# Patient Record
Sex: Male | Born: 1985 | Race: White | Hispanic: No | Marital: Single | Smoking: Current every day smoker
Health system: Southern US, Community
[De-identification: ages and names within clinical notes are randomized; demographics above are authoritative.]

## PROBLEM LIST (undated history)

## (undated) HISTORY — PX: KIDNEY SURGERY: SHX687

---

## 2015-07-10 ENCOUNTER — Emergency Department (HOSPITAL_COMMUNITY)
Admission: EM | Admit: 2015-07-10 | Discharge: 2015-07-11 | Disposition: A | Discharge status: Home / Self Care | Payer: No Typology Code available for payment source | Attending: Emergency Medicine | Admitting: Emergency Medicine

## 2015-07-10 ENCOUNTER — Encounter (HOSPITAL_COMMUNITY): Payer: Self-pay

## 2015-07-10 ENCOUNTER — Emergency Department (HOSPITAL_COMMUNITY): Payer: No Typology Code available for payment source

## 2015-07-10 DIAGNOSIS — Y9389 Activity, other specified: Secondary | ICD-10-CM | POA: Insufficient documentation

## 2015-07-10 DIAGNOSIS — S060X0A Concussion without loss of consciousness, initial encounter: Secondary | ICD-10-CM | POA: Diagnosis not present

## 2015-07-10 DIAGNOSIS — Y998 Other external cause status: Secondary | ICD-10-CM | POA: Diagnosis not present

## 2015-07-10 DIAGNOSIS — S0990XA Unspecified injury of head, initial encounter: Secondary | ICD-10-CM | POA: Diagnosis present

## 2015-07-10 DIAGNOSIS — S199XXA Unspecified injury of neck, initial encounter: Secondary | ICD-10-CM | POA: Insufficient documentation

## 2015-07-10 DIAGNOSIS — S0003XA Contusion of scalp, initial encounter: Secondary | ICD-10-CM | POA: Insufficient documentation

## 2015-07-10 DIAGNOSIS — Y92410 Unspecified street and highway as the place of occurrence of the external cause: Secondary | ICD-10-CM | POA: Diagnosis not present

## 2015-07-10 MED ORDER — HYDROCODONE-ACETAMINOPHEN 5-325 MG PO TABS
2.0000 | ORAL_TABLET | Freq: Once | ORAL | Status: AC
  Administered 2015-07-10: 2 via ORAL
  Filled 2015-07-10: qty 2
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
## 2015-07-11 MED ORDER — HYDROCODONE-ACETAMINOPHEN 5-325 MG PO TABS: ORAL_TABLET | ORAL | Status: AC

## 2015-07-11 NOTE — Discharge Instructions (Signed)
Rest, Ice intermittently (in the first 24-48 hours)   Take up to 800mg  of ibuprofen (that is usually 4 over the counter pills)  3 times a day for 5 days. Take with food.  Take vicodin for breakthrough pain, do not drink alcohol, drive, care for children or do other critical tasks while taking vicodin.  Do not participate in any sports or any activities that could result in head trauma until you are cleared by your primary care physician or neurologist.   Do not hesitate to return to the emergency room for any new, worsening or concerning symptoms.  Please obtain primary care using resource guide below. Let them know that you were seen in the emergency room and that they will need to obtain records for further outpatient management.   Concussion, Adult A concussion, or closed-head injury, is a brain injury caused by a direct blow to the head or by a quick and sudden movement (jolt) of the head or neck. Concussions are usually not life-threatening. Even so, the effects of a concussion can be serious. If you have had a concussion before, you are more likely to experience concussion-like symptoms after a direct blow to the head.  CAUSES  Direct blow to the head, such as from running into another player during a soccer game, being hit in a fight, or hitting your head on a hard surface.  A jolt of the head or neck that causes the brain to move back and forth inside the skull, such as in a car crash. SIGNS AND SYMPTOMS The signs of a concussion can be hard to notice. Early on, they may be missed by you, family members, and health care providers. You may look fine but act or feel differently. Symptoms are usually temporary, but they may last for days, weeks, or even longer. Some symptoms may appear right away while others may not show up for hours or days. Every head injury is different. Symptoms include:  Mild to moderate headaches that will not go away.  A feeling of pressure inside your  head.  Having more trouble than usual:  Learning or remembering things you have heard.  Answering questions.  Paying attention or concentrating.  Organizing daily tasks.  Making decisions and solving problems.  Slowness in thinking, acting or reacting, speaking, or reading.  Getting lost or being easily confused.  Feeling tired all the time or lacking energy (fatigued).  Feeling drowsy.  Sleep disturbances.  Sleeping more than usual.  Sleeping less than usual.  Trouble falling asleep.  Trouble sleeping (insomnia).  Loss of balance or feeling lightheaded or dizzy.  Nausea or vomiting.  Numbness or tingling.  Increased sensitivity to:  Sounds.  Lights.  Distractions.  Vision problems or eyes that tire easily.  Diminished sense of taste or smell.  Ringing in the ears.  Mood changes such as feeling sad or anxious.  Becoming easily irritated or angry for little or no reason.  Lack of motivation.  Seeing or hearing things other people do not see or hear (hallucinations). DIAGNOSIS Your health care provider can usually diagnose a concussion based on a description of your injury and symptoms. He or she will ask whether you passed out (lost consciousness) and whether you are having trouble remembering events that happened right before and during your injury. Your evaluation might include:  A brain scan to look for signs of injury to the brain. Even if the test shows no injury, you may still have a concussion.  Blood  tests to be sure other problems are not present. TREATMENT  Concussions are usually treated in an emergency department, in urgent care, or at a clinic. You may need to stay in the hospital overnight for further treatment.  Tell your health care provider if you are taking any medicines, including prescription medicines, over-the-counter medicines, and natural remedies. Some medicines, such as blood thinners (anticoagulants) and aspirin, may  increase the chance of complications. Also tell your health care provider whether you have had alcohol or are taking illegal drugs. This information may affect treatment.  Your health care provider will send you home with important instructions to follow.  How fast you will recover from a concussion depends on many factors. These factors include how severe your concussion is, what part of your brain was injured, your age, and how healthy you were before the concussion.  Most people with mild injuries recover fully. Recovery can take time. In general, recovery is slower in older persons. Also, persons who have had a concussion in the past or have other medical problems may find that it takes longer to recover from their current injury. HOME CARE INSTRUCTIONS General Instructions  Carefully follow the directions your health care provider gave you.  Only take over-the-counter or prescription medicines for pain, discomfort, or fever as directed by your health care provider.  Take only those medicines that your health care provider has approved.  Do not drink alcohol until your health care provider says you are well enough to do so. Alcohol and certain other drugs may slow your recovery and can put you at risk of further injury.  If it is harder than usual to remember things, write them down.  If you are easily distracted, try to do one thing at a time. For example, do not try to watch TV while fixing dinner.  Talk with family members or close friends when making important decisions.  Keep all follow-up appointments. Repeated evaluation of your symptoms is recommended for your recovery.  Watch your symptoms and tell others to do the same. Complications sometimes occur after a concussion. Older adults with a brain injury may have a higher risk of serious complications, such as a blood clot on the brain.  Tell your teachers, school nurse, school counselor, coach, athletic trainer, or work  Production designer, theatre/television/film about your injury, symptoms, and restrictions. Tell them about what you can or cannot do. They should watch for:  Increased problems with attention or concentration.  Increased difficulty remembering or learning new information.  Increased time needed to complete tasks or assignments.  Increased irritability or decreased ability to cope with stress.  Increased symptoms.  Rest. Rest helps the brain to heal. Make sure you:  Get plenty of sleep at night. Avoid staying up late at night.  Keep the same bedtime hours on weekends and weekdays.  Rest during the day. Take daytime naps or rest breaks when you feel tired.  Limit activities that require a lot of thought or concentration. These include:  Doing homework or job-related work.  Watching TV.  Working on the computer.  Avoid any situation where there is potential for another head injury (football, hockey, soccer, basketball, martial arts, downhill snow sports and horseback riding). Your condition will get worse every time you experience a concussion. You should avoid these activities until you are evaluated by the appropriate follow-up health care providers. Returning To Your Regular Activities You will need to return to your normal activities slowly, not all at once. You must  give your body and brain enough time for recovery.  Do not return to sports or other athletic activities until your health care provider tells you it is safe to do so.  Ask your health care provider when you can drive, ride a bicycle, or operate heavy machinery. Your ability to react may be slower after a brain injury. Never do these activities if you are dizzy.  Ask your health care provider about when you can return to work or school. Preventing Another Concussion It is very important to avoid another brain injury, especially before you have recovered. In rare cases, another injury can lead to permanent brain damage, brain swelling, or death. The  risk of this is greatest during the first 7-10 days after a head injury. Avoid injuries by:  Wearing a seat belt when riding in a car.  Drinking alcohol only in moderation.  Wearing a helmet when biking, skiing, skateboarding, skating, or doing similar activities.  Avoiding activities that could lead to a second concussion, such as contact or recreational sports, until your health care provider says it is okay.  Taking safety measures in your home.  Remove clutter and tripping hazards from floors and stairways.  Use grab bars in bathrooms and handrails by stairs.  Place non-slip mats on floors and in bathtubs.  Improve lighting in dim areas. SEEK MEDICAL CARE IF:  You have increased problems paying attention or concentrating.  You have increased difficulty remembering or learning new information.  You need more time to complete tasks or assignments than before.  You have increased irritability or decreased ability to cope with stress.  You have more symptoms than before. Seek medical care if you have any of the following symptoms for more than 2 weeks after your injury:  Lasting (chronic) headaches.  Dizziness or balance problems.  Nausea.  Vision problems.  Increased sensitivity to noise or light.  Depression or mood swings.  Anxiety or irritability.  Memory problems.  Difficulty concentrating or paying attention.  Sleep problems.  Feeling tired all the time. SEEK IMMEDIATE MEDICAL CARE IF:  You have severe or worsening headaches. These may be a sign of a blood clot in the brain.  You have weakness (even if only in one hand, leg, or part of the face).  You have numbness.  You have decreased coordination.  You vomit repeatedly.  You have increased sleepiness.  One pupil is larger than the other.  You have convulsions.  You have slurred speech.  You have increased confusion. This may be a sign of a blood clot in the brain.  You have increased  restlessness, agitation, or irritability.  You are unable to recognize people or places.  You have neck pain.  It is difficult to wake you up.  You have unusual behavior changes.  You lose consciousness. MAKE SURE YOU:  Understand these instructions.  Will watch your condition.  Will get help right away if you are not doing well or get worse.   This information is not intended to replace advice given to you by your health care provider. Make sure you discuss any questions you have with your health care provider.   Document Released: 09/03/2003 Document Revised: 07/04/2014 Document Reviewed: 01/03/2013 Elsevier Interactive Patient Education 2016 [employer]   Emergency Department Resource Guide 1) Find a Doctor and Pay Out of Pocket Although you won't have to find out who is covered by your insurance plan, it is a good idea to ask around and get recommendations.  You will then need to call the office and see if the doctor you have chosen will accept you as a new patient and what types of options they offer for patients who are self-pay. Some doctors offer discounts or will set up payment plans for their patients who do not have insurance, but you will need to ask so you aren't surprised when you get to your appointment.  2) Contact Your Local Health Department Not all health departments have doctors that can see patients for sick visits, but many do, so it is worth a call to see if yours does. If you don't know where your local health department is, you can check in your phone book. The CDC also has a tool to help you locate your state's health department, and many state websites also have listings of all of their local health departments.  3) Find a Walk-in Clinic If your illness is not likely to be very severe or complicated, you may want to try a walk in clinic. These are popping up all over the country in pharmacies, drugstores, and shopping centers. They're usually staffed by  nurse practitioners or physician assistants that have been trained to treat common illnesses and complaints. They're usually fairly quick and inexpensive. However, if you have serious medical issues or chronic medical problems, these are probably not your best option.  No Primary Care Doctor: - Call Health Connect at  [phone] - they can help you locate a primary care doctor that  accepts your insurance, provides certain services, etc. - Physician Referral Service- [phone]  Chronic Pain Problems: Organization         Address  Phone   Notes  [facility]  [phone] Patients need to be referred by their primary care doctor.   Medication Assistance: Organization         Address  Phone   Notes  [facility] Medication [facility] [address] [place] [place], [place] [address] [phone] --Must be a resident of [facility] -- Must have NO insurance coverage whatsoever (no Medicaid/ Medicare, etc.) -- The pt. MUST have a primary care doctor that directs their care regularly and follows them in the community   [facility]  [phone]   [employer]  [phone]    Agencies that provide inexpensive medical care: Organization         Address  Phone   Notes  [facility]  [phone]   [facility]    [phone]   [facility] [address] [place], [place] [address] [phone]   [facility] [address], [place] [phone]   Planned Parenthood    [phone]   [facility]    [phone]   [facility]  [address], [place] Phone:  [phone], Fax:  [phone] Hours of Operation:  9 am - 6 pm, M-F.  Also accepts Medicaid/Medicare and self-pay.  [facility]  [address], [place] Phone: [phone], Fax: [phone]. Hours of Operation:  8:30  am - 5:30 pm, M-F.  Also accepts Medicaid and self-pay.  [facility] [place] [address], [place] Phone: [phone]   [facility] [address] Natasha Bence [place], [place] [phone], Ext. [phone] Mondays & Thursdays: 7-9 AM.  First 15 patients are seen on a first come,  first serve basis.    Medicaid-accepting [facility] Providers:  Organization         Address  Phone   Notes  [facility] [address], [place] [phone] Also accepts self-pay patients.  [facility] [address] Laurell Josephs [place], [place]  [phone]   [facility] [address], [place] [phone]   [facility] [address] [place] [phone]   Renaye Rakers [address], [place]   [phone] Only accepts [place] patients after they have their name applied to their card.   Self-Pay (no insurance) in [facility]:  Organization         Address  Phone   Notes  Sickle Cell Patients, [facility] [address] [place], [place] [phone]   [facility] [address] [place], [place] [phone]   [facility] [place]  [address], [place] [phone]   [facility]/Dr. Osei-Bonsu  [address], [place] or [address], [place] [phone] Phone number for both [place] and [place] locations is the same.  [facility] [address], [place] [phone]   [facility] [address] [place] or [address] [phone]   [facility] [address], [place] [phone], phone; [phone], fax Sees patients 1st and 3rd Saturday of every month.  Must not qualify for public or private insurance (i.e. Medicaid, Medicare, [place] Health  Choice, Veterans' Benefits)  Household income should be no more than 200% of the poverty level The clinic cannot treat you if you are pregnant or think you are pregnant  Sexually transmitted diseases are not treated at the clinic.    Dental Care: Organization         Address  Phone  Notes  [facility] Department of [facility] [address] [place], [place] [phone] Accepts children up to age 68 who are enrolled in [place] or [place] Health Choice; pregnant women with a Medicaid card; and children who have applied for Medicaid or [place] Health Choice, but were declined, whose parents can pay a reduced fee at time of service.  [facility] of [facility]  [address], [place] [phone] Accepts children up to age 62 who are enrolled in [place] or [place] Health Choice; pregnant women with a Medicaid card; and children who have applied for Medicaid or  Health Choice, but were declined, whose parents can pay a reduced fee at time of service.  [facility]  [address] [place], [place] [phone] Patients are seen by appointment only. Walk-ins are not accepted. [facility] will see patients 83 years of age and older. Monday - Tuesday (8am-5pm) Most Wednesdays (8:30-5pm) $30 per visit, cash only  [facility] Adult Dental Access PROGRAM  [address], [facility] [phone] Patients are seen by appointment only. Walk-ins are not accepted. [facility] will see patients 20 years of age and older. One Wednesday Evening (Monthly: Volunteer Based).  $30 per visit, cash only  [employer]  [phone] for adults; Children under age 38, call Graduate Pediatric Dentistry at [phone]. Children aged 57-14, please call (  [phone]) [phone] to request a pediatric application.  Dental services are provided in all areas of dental care including fillings, crowns and bridges, complete  and partial dentures, implants, gum treatment, root canals, and extractions. Preventive care is also provided. Treatment is provided to both adults and children. Patients are selected via a lottery and there is often a waiting list.   [facility] [address], [place]  [phone] [email or web address]   [facility] [address] [place], [place] [phone], Ext. [phone] Second and Fourth Thursday of each month, opens at 6:30 AM; Clinic ends at 9 AM.  Patients are seen on a first-come first-served basis, and a limited number are seen during each clinic.   [facility]  [address] Ether Griffins [place], [place] [phone]   Eligibility Requirements You must have lived in [place], [place], or [place] counties for at least the last three months.   You cannot be eligible for state or federal sponsored [place], including [employer], [place], or [employer].   You generally cannot be eligible for healthcare insurance through your employer.    How to apply: Eligibility screenings are held every Tuesday and Wednesday afternoon from 1:00 pm until 4:00 pm. You do not need an appointment for the interview!  [facility] [address], [place], [place] [phone]   [facility] Health Department  [phone]   [facility] Health Department  [phone]   [facility] Health Department  [phone]    Behavioral Health Resources in the Community: Intensive Outpatient Programs Organization         Address  Phone  Notes  [facility] [address], [place], [place] [phone]   [facility] Outpatient [address] [place], [place] [phone]   [facility] [address] [place], [place]  [phone]   [facility] Mental Health [address]  [place], [place] [phone] or [phone]   Substance Abuse Resources Organization          Address  Phone  Notes  [facility]  [phone]   [facility]  [phone]   [facility]  [phone]   Floydene Flock  [phone]   Residential & Outpatient Substance Abuse Program  [phone]   Psychological Services Organization         Address  Phone  Notes  [facility] Behavioral Health  [phone]   [facility] Services  [phone]   [facility] Mental Health [address], [place] [phone] or [phone]    Mobile Crisis Teams Organization         Address  Phone  Notes  [facility], Mobile Crisis Care Unit  [phone]   [facility]  [address] [place], [place] [phone]   Doristine Locks [address] [place] [phone]    Self-Help/Support Groups Organization         Address  Phone             Notes  [facility] [place] - variety of support groups  336- I7437963 Call for more information  Narcotics Anonymous (NA), [facility] [address], [employer] [place]  2 meetings at this location   Statistician         Address  Phone  Notes  [facility] [address],    [place]  [phone]   [facility]  [id number], [place], [place] [phone]   [facility] [address] [facility] [place], [place] [phone] Admissions: 8am-3pm M-F  [facility] [address],    [place], [place] [phone]   [facility] [address] [place], [place], [place] [phone]   [facility] [address]  [place], [place] [phone]   [facility] - Intensive Outpatient [address] Laurell Josephs [address], [place], [place] [phone]   [facility] ([facility] [address] [place]  [place], [place] [phone] or [phone]   [facility] ([facility]) [address], [place], [place]  [phone] Accepts Medicaid  [facility] [address]  [place] [phone] Substance Abuse/Addiction Treatment   [facility] Organization         Address  Phone  Notes  [facility]  [phone]   Angie Fava, PhD [address] Ervin Knack [place], [place]   [phone] or [phone]   [facility] Behavioral   [address] [place], [place] [phone]   [facility] [address], [place], [place] [phone] Insurance/Medicaid/sponsorship through [facility] [address]                                    [place], [place] [phone] Therapy/tele-psych/case  [facility] [address], [place] [phone]    Dr. Lolly Mustache  [phone]   [facility]  [employer] [facility] Dept. 1) [address] [place] 2) [address], [place] 3)  [address], [place] [phone]  [phone]   [facility] Child Abuse Hotline 678-022-8951 or [phone] (After Hours)
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
## 2019-10-11 ENCOUNTER — Emergency Department (HOSPITAL_COMMUNITY)
Admission: EM | Admit: 2019-10-11 | Discharge: 2019-10-11 | Disposition: A | Discharge status: Home / Self Care | Payer: Self-pay | Attending: Emergency Medicine | Admitting: Emergency Medicine

## 2019-10-11 DIAGNOSIS — F17210 Nicotine dependence, cigarettes, uncomplicated: Secondary | ICD-10-CM | POA: Insufficient documentation

## 2019-10-11 DIAGNOSIS — F10129 Alcohol abuse with intoxication, unspecified: Secondary | ICD-10-CM | POA: Insufficient documentation

## 2019-10-11 DIAGNOSIS — X781XXA Intentional self-harm by knife, initial encounter: Secondary | ICD-10-CM | POA: Insufficient documentation

## 2019-10-11 DIAGNOSIS — F1020 Alcohol dependence, uncomplicated: Secondary | ICD-10-CM

## 2019-10-11 DIAGNOSIS — Y908 Blood alcohol level of 240 mg/100 ml or more: Secondary | ICD-10-CM | POA: Insufficient documentation

## 2019-10-11 DIAGNOSIS — S51811A Laceration without foreign body of right forearm, initial encounter: Secondary | ICD-10-CM | POA: Insufficient documentation

## 2019-10-11 DIAGNOSIS — Z20822 Contact with and (suspected) exposure to covid-19: Secondary | ICD-10-CM | POA: Insufficient documentation

## 2019-10-11 DIAGNOSIS — F329 Major depressive disorder, single episode, unspecified: Secondary | ICD-10-CM | POA: Insufficient documentation

## 2019-10-11 DIAGNOSIS — X789XXA Intentional self-harm by unspecified sharp object, initial encounter: Secondary | ICD-10-CM

## 2019-10-11 DIAGNOSIS — Y9389 Activity, other specified: Secondary | ICD-10-CM | POA: Insufficient documentation

## 2019-10-11 DIAGNOSIS — Y929 Unspecified place or not applicable: Secondary | ICD-10-CM | POA: Insufficient documentation

## 2019-10-11 DIAGNOSIS — F419 Anxiety disorder, unspecified: Secondary | ICD-10-CM | POA: Insufficient documentation

## 2019-10-11 DIAGNOSIS — Y999 Unspecified external cause status: Secondary | ICD-10-CM | POA: Insufficient documentation

## 2019-10-11 DIAGNOSIS — Z046 Encounter for general psychiatric examination, requested by authority: Secondary | ICD-10-CM

## 2019-10-11 LAB — COMPREHENSIVE METABOLIC PANEL
ALT: 32 U/L (ref 0–44)
AST: 23 U/L (ref 15–41)
Albumin: 4.2 g/dL (ref 3.5–5.0)
Alkaline Phosphatase: 66 U/L (ref 38–126)
Anion gap: 11 (ref 5–15)
BUN: 7 mg/dL (ref 6–20)
CO2: 23 mmol/L (ref 22–32)
Calcium: 8.9 mg/dL (ref 8.9–10.3)
Chloride: 110 mmol/L (ref 98–111)
Creatinine, Ser: 0.7 mg/dL (ref 0.61–1.24)
GFR calc Af Amer: >60 mL/min (ref >60)
GFR calc non Af Amer: >60 mL/min (ref >60)
Glucose, Bld: 87 mg/dL (ref 70–99)
Potassium: 3.6 mmol/L (ref 3.5–5.1)
Sodium: 144 mmol/L (ref 135–145)
Total Bilirubin: 0.3 mg/dL (ref 0.3–1.2)
Total Protein: 6.8 g/dL (ref 6.5–8.1)

## 2019-10-11 LAB — RESPIRATORY PANEL BY RT PCR (FLU A&B, COVID)
Influenza A by PCR: NEGATIVE
Influenza B by PCR: NEGATIVE
SARS Coronavirus 2 by RT PCR: NEGATIVE

## 2019-10-11 LAB — CBC WITH DIFFERENTIAL/PLATELET
Abs Immature Granulocytes: 0.03 10*3/uL (ref 0.00–0.07)
Basophils Absolute: 0.1 10*3/uL (ref 0.0–0.1)
Basophils Relative: 1 %
Eosinophils Absolute: 0.7 10*3/uL — ABNORMAL HIGH (ref 0.0–0.5)
Eosinophils Relative: 7 %
HCT: 45 % (ref 39.0–52.0)
Hemoglobin: 15.4 g/dL (ref 13.0–17.0)
Immature Granulocytes: 0 %
Lymphocytes Relative: 34 %
Lymphs Abs: 3.3 10*3/uL (ref 0.7–4.0)
MCH: 33.6 pg (ref 26.0–34.0)
MCHC: 34.2 g/dL (ref 30.0–36.0)
MCV: 98 fL (ref 80.0–100.0)
Monocytes Absolute: 0.7 10*3/uL (ref 0.1–1.0)
Monocytes Relative: 7 %
Neutro Abs: 5.1 10*3/uL (ref 1.7–7.7)
Neutrophils Relative %: 51 %
Platelets: 393 10*3/uL (ref 150–400)
RBC: 4.59 MIL/uL (ref 4.22–5.81)
RDW: 13.5 % (ref 11.5–15.5)
WBC: 9.9 10*3/uL (ref 4.0–10.5)
nRBC: 0 % (ref 0.0–0.2)

## 2019-10-11 LAB — ETHANOL: Alcohol, Ethyl (B): 276 mg/dL — ABNORMAL HIGH (ref <10)

## 2019-10-11 LAB — RAPID URINE DRUG SCREEN, HOSP PERFORMED
Amphetamines: NOT DETECTED
Barbiturates: NOT DETECTED
Benzodiazepines: NOT DETECTED
Cocaine: NOT DETECTED
Opiates: NOT DETECTED
Tetrahydrocannabinol: NOT DETECTED

## 2019-10-11 LAB — SALICYLATE LEVEL: Salicylate Lvl: <7 mg/dL — ABNORMAL LOW (ref 7.0–30.0)

## 2019-10-11 LAB — ACETAMINOPHEN LEVEL: Acetaminophen (Tylenol), Serum: <10 ug/mL — ABNORMAL LOW (ref 10–30)

## 2019-10-11 MED ORDER — SODIUM CHLORIDE 0.9 % IV BOLUS
1000.0000 mL | Freq: Once | INTRAVENOUS | Status: AC
Start: 2019-10-11 — End: 2019-10-11
  Administered 2019-10-11: 1000 mL via INTRAVENOUS

## 2019-10-11 MED ORDER — LORAZEPAM 2 MG/ML IJ SOLN: 0.0000 mg | Freq: Four times a day (QID) | INTRAMUSCULAR | Status: DC

## 2019-10-11 MED ORDER — LIDOCAINE-EPINEPHRINE (PF) 2 %-1:200000 IJ SOLN
10.0000 mL | Freq: Once | INTRAMUSCULAR | Status: DC
  Filled 2019-10-11: qty 20

## 2019-10-11 MED ORDER — LORAZEPAM 1 MG PO TABS: 0.0000 mg | ORAL_TABLET | Freq: Two times a day (BID) | ORAL | Status: DC

## 2019-10-11 MED ORDER — THIAMINE HCL 100 MG/ML IJ SOLN: 100.0000 mg | Freq: Every day | INTRAMUSCULAR | Status: DC

## 2019-10-11 MED ORDER — ZOLPIDEM TARTRATE 5 MG PO TABS: 5.0000 mg | ORAL_TABLET | Freq: Every evening | ORAL | Status: DC | PRN

## 2019-10-11 MED ORDER — NICOTINE 21 MG/24HR TD PT24
21.0000 mg | MEDICATED_PATCH | Freq: Once | TRANSDERMAL | Status: DC
  Administered 2019-10-11: 21 mg via TRANSDERMAL
  Filled 2019-10-11: qty 1

## 2019-10-11 MED ORDER — ACETAMINOPHEN 325 MG PO TABS: 650.0000 mg | ORAL_TABLET | ORAL | Status: DC | PRN

## 2019-10-11 MED ORDER — LORAZEPAM 1 MG PO TABS
0.0000 mg | ORAL_TABLET | Freq: Four times a day (QID) | ORAL | Status: DC
  Administered 2019-10-11: 1 mg via ORAL
  Filled 2019-10-11: qty 1

## 2019-10-11 MED ORDER — LORAZEPAM 2 MG/ML IJ SOLN: 0.5000 mg | Freq: Once | INTRAMUSCULAR | Status: DC

## 2019-10-11 MED ORDER — LORAZEPAM 2 MG/ML IJ SOLN: 0.0000 mg | Freq: Two times a day (BID) | INTRAMUSCULAR | Status: DC

## 2019-10-11 MED ORDER — ALUM & MAG HYDROXIDE-SIMETH 200-200-20 MG/5ML PO SUSP: 30.0000 mL | Freq: Four times a day (QID) | ORAL | Status: DC | PRN

## 2019-10-11 MED ORDER — THIAMINE HCL 100 MG PO TABS
100.0000 mg | ORAL_TABLET | Freq: Every day | ORAL | Status: DC
  Administered 2019-10-11: 100 mg via ORAL
  Filled 2019-10-11: qty 1

## 2019-10-11 MED ORDER — ONDANSETRON HCL 4 MG PO TABS: 4.0000 mg | ORAL_TABLET | Freq: Three times a day (TID) | ORAL | Status: DC | PRN

## 2019-10-11 NOTE — BHH Counselor (Signed)
Clinician spoke to Gabriel Rung, Charity fundraiser. RN to set up TTS chart in pt's room, in 10 minutes.    Redmond Pulling, MS, [facility], [facility] Triage Specialist [phone]

## 2019-10-11 NOTE — ED Notes (Signed)
Pt changed into wine colored scrubs. Pt cooperative at this time.

## 2019-10-11 NOTE — ED Notes (Signed)
Pt belongings inventoried and placed in locker #4  Covid swab complete and sent to lab

## 2019-10-11 NOTE — ED Notes (Signed)
Pt unhooked his own IV port and stated that he did not need anymore fluids. Pt. Is compliant and getting more anxious.

## 2019-10-11 NOTE — ED Provider Notes (Signed)
Emergency Medicine Observation Re-evaluation Note  George Mccormick is a 34 y.o. male, seen on rounds today.  Pt initially presented to the ED for complaints of Extremity Laceration Currently, the patient is alert, calm and cooperative.  He recounts the accurate history about why he is here and what happened to him.  He denies active suicidal ideation or plans.  He feels like he is in a good environment to give him support.  He admits to heavy alcohol use/alcoholism, but is not willing to change that behavior at this time.Marland Kitchen  Physical Exam  BP 116/71 (BP Location: Left Arm)   Pulse 96   Temp 98.8 F (37.1 C) (Oral)   Resp 18   Ht 5\' 8"  (1.727 m)   Wt 56.7 kg   SpO2 100%   BMI 19.01 kg/m  Physical Exam Cooperative, insightful and lucid.  Wound right forearm, bandage.  He has intact distal sensation, circulation and is able to extend the wrist without limitation.  He does not appear to be responding to internal stimuli. ED Course / MDM  EKG:    I have reviewed the labs performed to date as well as medications administered while in observation.  Recent changes in the last 24 hours include patient has become more sober, is now lucid and not suicidal.  He has been evaluated comprehensively by TTS who have gotten collateral information and commitment for support, from the patient's friend, Rob.  Rob plans on coming in getting the patient and will help to prevent future self-harm, and encouraged him to stop drinking alcohol.. Plan  Current plan is for discharge with his friend, Rob.  [facility].  Patient encouraged to go to AA for alcoholism.  He is to return here for wound care/suture removal in about 1 week.  He is encouraged to return sooner for any problems with feelings of self-harm, suicidal ideation or complications from his wound.  I have rescinded his IVC. Patient is not under full IVC at this time.   , MD 10/11/19 1149

## 2019-10-11 NOTE — ED Triage Notes (Signed)
Pt. Arrived via EMS. Pt sustained self inflicted laceration to his right forearm with a box cutter. Pt. Told EMS he wanted to just feel something. Pt admits to drinking 6 12oz beers. EMS estimated 40 mL of blood loss. Pt is alert and oriented x 4. Vitals WDL: on scene with cbg 92.

## 2019-10-11 NOTE — ED Notes (Signed)
Breakfast tray ordered 

## 2019-10-11 NOTE — Progress Notes (Signed)
Patient ID: George Mccormick, male   DOB: Jul 18, 1985, 34 y.o.   MRN: [id number]   Psychiatric reassessment   HPI: George Mccormick is an 34 y.o. male, who presents involuntary and unaccompanied to [facility].Clinician asked the pt, "what brought you to the hospital?" Pt reported, "I self-harmed, meant to make a small scratch." Per pt, the knife he used was "way too sharp." Pt reported, he drank beers before cutting himself. Pt reported, he has been cutting himself for 15 years. Pt was unable to discuss what triggered him to cut and how often he cuts. Pt denies, SI, HI, self-injurious behaviors and access to weapons (per pt his knife was taken.)   Pt reported, drinking 6 pack of beers, daily. Pt reports, smoking a pack of cigarettes, daily. Pt denies, being linked to OPT resources (medication management and/or counseling.) Pt denies, previous mental health diagnosis however pt has a previous inpatient admission to [facility] 10 years ago.   Psychiatric evaluation: George Mccormick is a 34 year old male who presented to [facility] following self-harm. During this evaluation, he was pleasant, alert and oriented x4, calm and cooperative. He acknowledged his reason for admission stating that yesterday, he became overwhelmed thinking about work and life, so he cut his forearm with a razor. He denied that it was a suicide attempt. He stated," I just cut myself to deep when I really wanted to scratch. I didn't realize that the razor was that sharp."  He reported a history of NSSIB although his last engagement  in the behavior was over a year ago. He denied prior SA. He added that on the day of the incident, he drank a few beers and stated that he drink a 6 pack of beers daily. He denied other substance abuse or use. He denied current SI. HI, psychosis or urges to self-harm. He stated that his history of cutting was an emotional coping mechanism. He reported one prior psychiatric hospitalization to [facility] 10 years ago. He denied  receiving  current out[patient therapy although stated he has [facility] past and his plan is to reach out the the center to have therapy session restarted. He denied access to firearms.   He provided verbal consent to speak to Rob, a friend that he is currently living with. Rob was contacted at [phone]. He stated that patient was not trying to kill himself. He added that patient has a history of self harming behaviors for the purpose of," feeling an emotion" and that patient had not engaged in those behaviors in several months. He stated that patient had a "breakthrough" overt the past two months and he had taken himself out of a bad situation. Stated, "now that he is in a better situation and a more positive environment, he is having difficulties coping as he was so use to being in that situation.: He stated that he is also a Nurse, children's and he has been helping patient learn better ways to cope with his past turmoil. He added that patient did have maybe too much to drink that night but stated that patient had been drinking less compared to the past. He stated that patient missed a court date and now has a bench warrant following a DUI which he believes may have caused patient to worry and become overwhelmed. He again denied any safety concerns and stated that he did not think patient was a danger to himself or others. He confirms that there are no  firearms in the home.   Disposition: Patient denies SI, HI or AVH. He has a history of NSSIB, none recent, and no prior SA. 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 At this time, there is no evidence of imminent risk to self or others at present. Patient does not meet criteria for psychiatric inpatient admission and is therefore, psychiatrically cleared. We discussed outpatient therapy and he was open. He stated that his plan was to   Get reestablished with the [facility] for theraputic needs.   I discussed with patient and friend Rob that  If the patient's symptoms worsen or do not continue to improve or if the patient becomes actively suicidal or homicidal then it is recommended that the patient return to the closest hospital emergency room or call 911 for further evaluation and treatment. National Suicide Prevention Lifeline 1800-SUICIDE or (913)220-9782. Both denied that there were firearms in the home and Rob was educated about removing/locking away medications, sharps, and other dangerous products from the home which he stated he had already had the conversation with patient.   ED updated on disposition. Patient friend Leonor Liv stated that he could pick patient up at 11:00 am today.

## 2019-10-11 NOTE — ED Notes (Signed)
When assessing pt, Pt was very anxious and wanting to go home. 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 Pt denies HI/SI at this time and states it was a silly mistake cutting his arm and he was drinking. Pt speech is rapid. Pt states he drinks around 6 beers/day and last drink was right before arriving to ED.

## 2019-10-11 NOTE — BH Assessment (Addendum)
Tele Assessment Note   Patient Name: George Mccormick MRN: [id number] Referring Physician: Jeanie Sewer, PA-C. Location of Patient: [facility], [phone]. Location of Provider: Behavioral Health TTS Department  George Mccormick is an 34 y.o. male, who presents involuntary and unaccompanied to [facility].Clinician asked the pt, "what brought you to the hospital?" Pt reported, "I self-harmed, meant to make a small scratch." Per pt, the knife he used was "way too sharp." Pt reported, he drank beers before cutting himself. Pt reported, he has been cutting himself for 15 years. Pt was unable to discuss what triggered him to cut and how often he cuts. Pt denies, SI, HI, self-injurious behaviors and access to weapons (per pt his knife was taken.)   Pt reported, drinking 6 pack of beers, daily. Pt reports, smoking a pack of cigarettes, daily. Pt denies, being linked to OPT resources (medication management and/or counseling.) Pt denies, previous mental health diagnosis however pt has a previous inpatient admission to [facility] 10 years ago.   Pt presents alert with logical, coherent speech. Pt's eye contact was good. Pt's mood, affect was pleasant. Pt's thought process was coherent, relevant. Pt's judgement was impaired. Pt was oriented x4. Pt's concentration was normal. Pt's insight was fair. Pt's impulse control was poor. Pt reported, if discharged from [facility] he could contract for safety.  *Pt reported, having family, friend supports. Pt declined for clinician to contact family, friend supports to gather collateral information.*  Diagnosis: Deferred.   Past Medical History: No past medical history on file.  Family History: No family history on file.  Social History:  has no history on file for tobacco, alcohol, and drug.  Additional Social History:  Alcohol / Drug Use Pain Medications: See MAR Prescriptions: See MAR Over the Counter: See MAR History of alcohol / drug use?: Yes Substance #1 Name of  Substance 1: Alcohol. 1 - Age of First Use: UTA 1 - Amount (size/oz): Pt reported, drinking 6 pack of beers, daily. 1 - Frequency: Daily. 1 - Duration: Ongoing. 1 - Last Use / Amount: Before cutting him self. Substance #2 Name of Substance 2: Cigarettes. 2 - Age of First Use: UTA 2 - Amount (size/oz): Pt reports, smoking a pack of cigarettes, daily. 2 - Frequency: Daily. 2 - Duration: Ongoing. 2 - Last Use / Amount: Daily.  CIWA: CIWA-Ar BP: (!) 115/93 Pulse Rate: 86 COWS:    Allergies: No Known Allergies  Home Medications: (Not in a hospital admission)   OB/GYN Status:  No LMP for male patient.  General Assessment Data Assessment unable to be completed: Yes Reason for not completing assessment: Clinician spoke to Gabriel Rung, RN. RN to set up TTS chart in pt's room, in 10 minutes.  Location of Assessment: [facility] ED TTS Assessment: In system Is this a Tele or Face-to-Face Assessment?: Tele Assessment Is this an Initial Assessment or a Re-assessment for this encounter?: Initial Assessment Patient Accompanied by:: N/A Language Other than English: No Living Arrangements: Other (Comment)(Father. ) What gender do you identify as?: Male Marital status: Single Living Arrangements: Parent Can pt return to current living arrangement?: Yes Admission Status: Voluntary Is patient capable of signing voluntary admission?: Yes Referral Source: Self/Family/Friend Insurance type: Self-pay.      Crisis Care Plan Living Arrangements: Parent Legal Guardian: Other:(Self. ) Name of Psychiatrist: NA Name of Therapist: NA  Education Status Is patient currently in school?: No Is the patient employed, unemployed or receiving disability?: Employed  Risk to self with the past 6 months  Suicidal Ideation: No(Pt denies. ) Has patient been a risk to self within the past 6 months prior to admission? : No(Pt denies. ) Suicidal Intent: No Has patient had any suicidal intent within the past 6 months  prior to admission? : No Is patient at risk for suicide?: No Suicidal Plan?: No Has patient had any suicidal plan within the past 6 months prior to admission? : No Access to Means: No What has been your use of drugs/alcohol within the last 12 months?: Alcohol, cigarettes.  Previous Attempts/Gestures: No(Pt denies. ) How many times?: 0 Other Self Harm Risks: Cutting.  Triggers for Past Attempts: None known Intentional Self Injurious Behavior: Cutting Comment - Self Injurious Behavior: Pt cut himself prior to coming to the ED.  Family Suicide History: No Recent stressful life event(s): Other (Comment)(Pt denies, stressors. ) Persecutory voices/beliefs?: No(Pt denies. ) Depression: No(Pt denies. ) Substance abuse history and/or treatment for substance abuse?: Yes(Per chart. ) Suicide prevention information given to non-admitted patients: Not applicable  Risk to Others within the past 6 months Homicidal Ideation: No(Pt denies. ) Does patient have any lifetime risk of violence toward others beyond the six months prior to admission? : No Thoughts of Harm to Others: No Current Homicidal Intent: No Current Homicidal Plan: No Access to Homicidal Means: No Identified Victim: NA History of harm to others?: No Assessment of Violence: None Noted Violent Behavior Description: NA Does patient have access to weapons?: No(Pt denies. ) Criminal Charges Pending?: No Does patient have a court date: No Is patient on probation?: No  Psychosis Hallucinations: None noted Delusions: None noted  Mental Status Report Appearance/Hygiene: Unremarkable Eye Contact: Good Motor Activity: Unremarkable Speech: Logical/coherent Level of Consciousness: Alert Anxiety Level: Minimal Thought Processes: Coherent, Relevant Judgement: Impaired Orientation: Person, Place, Time, Situation Obsessive Compulsive Thoughts/Behaviors: None  Cognitive Functioning Concentration: Normal Memory: Recent Intact Is  patient IDD: No Insight: Fair Impulse Control: Poor Appetite: Good Sleep: No Change Total Hours of Sleep: 8 Vegetative Symptoms: None  ADLScreening [facility] Assessment Services) Patient's cognitive ability adequate to safely complete daily activities?: Yes Patient able to express need for assistance with ADLs?: Yes Independently performs ADLs?: Yes (appropriate for developmental age)  Prior Inpatient Therapy Prior Inpatient Therapy: Yes Prior Therapy Dates: 10 years ago.  Prior Therapy Facilty/Provider(s): [facility].  Reason for Treatment: Cutting.   Prior Outpatient Therapy Prior Outpatient Therapy: No Does patient have an ACCT team?: No Does patient have Intensive In-House Services?  : No Does patient have [facility] services? : No Does patient have P4CC services?: No  ADL Screening (condition at time of admission) Patient's cognitive ability adequate to safely complete daily activities?: Yes Is the patient deaf or have difficulty hearing?: No Does the patient have difficulty seeing, even when wearing glasses/contacts?: No Does the patient have difficulty concentrating, remembering, or making decisions?: No Patient able to express need for assistance with ADLs?: Yes Does the patient have difficulty dressing or bathing?: No Independently performs ADLs?: Yes (appropriate for developmental age) Does the patient have difficulty walking or climbing stairs?: No Weakness of Legs: None Weakness of Arms/Hands: None  Home Assistive Devices/Equipment Home Assistive Devices/Equipment: None    Abuse/Neglect Assessment (Assessment to be complete while patient is alone) Abuse/Neglect Assessment Can Be Completed: Yes Physical Abuse: Denies Verbal Abuse: Denies Sexual Abuse: Denies Exploitation of patient/patient's resources: Denies Self-Neglect: Denies     Regulatory affairs officer (For Healthcare) Does Patient Have a Medical Advance Directive?: No  Disposition: Renaye Rakers,  NP recommends observed and reassessed by psychiatry. Disposition discussed with Dr. Elesa Massed and Gabriel Rung, RN.    Disposition Initial Assessment Completed for this Encounter: Yes  This service was provided via telemedicine using a 2-way, interactive audio and video technology.  Names of all persons participating in this telemedicine service and their role in this encounter. Name: Axton Cihlar. Role: Patient.  Name: Redmond Pulling, MS, [facility], [facility]. Role: Counselor.           Redmond Pulling 10/11/2019 4:01 AM     Redmond Pulling, MS, [facility], [facility] Triage Specialist [phone]

## 2019-10-11 NOTE — ED Notes (Signed)
Patient sleeping woke up by calling name. TTS ready placed in room. Patient calm and cooperative.

## 2019-10-11 NOTE — ED Provider Notes (Signed)
[facility] EMERGENCY DEPARTMENT Provider Note   CSN: [id number] Arrival date & time: 10/11/19  4268     History Chief Complaint  Patient presents with  . Extremity Laceration    George Mccormick is a 34 y.o. male with history of anxiety and depression, alcohol abuse, chronic gastritis presents brought in by EMS for evaluation of acute onset, persistent wound to the right forearm.  He reports that at around 1 AM he used a sharp blade to self-harm.  He states that he has a history of self-harm but that he has not done so in approximately 1 year and states "I was doing good".  He states "I was not trying to kill myself I just wanted to feel something".  He states "I did not intend to go that deep and when I saw how deep it was I panicked" call my stepdad".  He then elevated the area.  EMS notes that a rope tourniquet was applied by acquaintance at the scene but that the rope was directly in the wound.  He denies homicidal ideation or auditory or visual hallucinations.  He reports drinking approximately six 12 ounce beers prior to self harming and reports that this is a typical amount of alcohol consumed on a daily basis for him.  Last used cocaine 1 month ago but states he has not used any recreational drugs otherwise since then.  He denies numbness or tingling to the right upper extremity but notes persistent throbbing pain to the area of the laceration.  He is not on any blood thinners. His tetanus is up-to-date per chart review using care everywhere documents he had a Tdap administered on 11/16/2018.  He is left-hand dominant.  EMS reports that the patient lives in a camper in the backyard of his friend's home.  Per their collateral information obtained at the scene the patient recently left a bad situation in another city where he was involved with drug dealers.  They state that he has a very poor support system.  The history is provided by the patient and the EMS personnel.       No past medical history on file.  There are no problems to display for this patient.    No family history on file.  Social History   Tobacco Use  . Smoking status: Not on file  Substance Use Topics  . Alcohol use: Not on file  . Drug use: Not on file    Home Medications Prior to Admission medications   Medication Sig Start Date End Date Taking? Authorizing Provider  Aspirin-Acetaminophen-Caffeine (GOODYS EXTRA STRENGTH) [phone] MG PACK Take 1 Package by mouth daily as needed (Pain).   Yes [provider]  calcium carbonate (TUMS - DOSED IN MG ELEMENTAL CALCIUM) 500 MG chewable tablet Chew 1 tablet by mouth 2 (two) times daily as needed for indigestion or heartburn.   Yes [provider]  HYDROcodone-acetaminophen (NORCO/VICODIN) 5-325 MG tablet Take 1-2 tablets by mouth every 6 hours as needed for pain and/or cough. Patient not taking: Reported on 10/11/2019 07/11/15   Pisciotta, Elmyra Ricks, PA-C    Allergies    Patient has no known allergies.  Review of Systems   Review of Systems  Constitutional: Negative for chills and fever.  Skin: Positive for wound.  Neurological: Negative for weakness and numbness.  Psychiatric/Behavioral: Positive for self-injury. Negative for hallucinations and suicidal ideas.  All other systems reviewed and are negative.   Physical Exam Updated Vital Signs BP 120/90  Pulse 90   Temp 97.7 F (36.5 C) (Oral)   Resp 18   Ht 5\' 8"  (1.727 m)   Wt 56.7 kg   SpO2 100%   BMI 19.01 kg/m   Physical Exam Vitals and nursing note reviewed.  Constitutional:      General: He is not in acute distress.    Appearance: He is well-developed.  HENT:     Head: Normocephalic and atraumatic.  Eyes:     General:        Right eye: No discharge.        Left eye: No discharge.     Conjunctiva/sclera: Conjunctivae normal.  Neck:     Vascular: No JVD.     Trachea: No tracheal deviation.  Cardiovascular:     Rate and Rhythm: Normal  rate.     Pulses: Normal pulses.     Comments: 2+ radial pulses bilaterally Pulmonary:     Effort: Pulmonary effort is normal.  Abdominal:     General: There is no distension.  Musculoskeletal:     Comments: 10 cm linear horizontal laceration involving the mid right forearm.  There is visible muscle belly.  He is able to flex and extend the right wrist and digits against resistance without difficulty.  Good and equal grip strength bilaterally.  He has some difficulty with thumb opposition but states that this is chronic and unchanged due to a history of cerebral palsy.  Skin:    General: Skin is warm and dry.     Findings: No erythema.     Comments: Numerous healed linear scars noted consistent with history of self-harm, no signs of secondary skin infection  Neurological:     Mental Status: He is alert.     Comments: Sensation intact to light touch of bilateral upper extremities  Psychiatric:        Attention and Perception: Attention normal.        Mood and Affect: Mood is anxious.        Speech: Speech normal.        Behavior: Behavior is cooperative.        Thought Content: Thought content does not include homicidal or suicidal ideation.        Judgment: Judgment is impulsive.        ED Results / Procedures / Treatments   Labs (all labs ordered are listed, but only abnormal results are displayed) Labs Reviewed  ETHANOL - Abnormal; Notable for the following components:      Result Value   Alcohol, Ethyl (B) 276 (*)    All other components within normal limits  CBC WITH DIFFERENTIAL/PLATELET - Abnormal; Notable for the following components:   Eosinophils Absolute 0.7 (*)    All other components within normal limits  SALICYLATE LEVEL - Abnormal; Notable for the following components:   Salicylate Lvl <7.0 (*)    All other components within normal limits  ACETAMINOPHEN LEVEL - Abnormal; Notable for the following components:   Acetaminophen (Tylenol), Serum <10 (*)    All  other components within normal limits  RESPIRATORY PANEL BY RT PCR (FLU A&B, COVID)  COMPREHENSIVE METABOLIC PANEL  RAPID URINE DRUG SCREEN, HOSP PERFORMED    EKG None  Radiology No results found.  Procedures . Laceration Repair  Date/Time: 10/11/2019 5:26 AM Performed by: 10/13/2019, PA-C Authorized by: Jeanie Sewer, PA-C   Consent:    Consent obtained:  Verbal   Consent given by:  Patient   Risks discussed:  Infection, need for additional repair, pain, poor cosmetic result and poor wound healing   Alternatives discussed:  No treatment and delayed treatment Universal protocol:    Procedure explained and questions answered to patient or proxy's satisfaction: yes     Relevant documents present and verified: yes     Test results available and properly labeled: yes     Imaging studies available: yes     Required blood products, implants, devices, and special equipment available: yes     Site/side marked: yes     Immediately prior to procedure, a time out was called: yes     Patient identity confirmed:  Verbally with patient Anesthesia (see MAR for exact dosages):    Anesthesia method:  Local infiltration   Local anesthetic:  Lidocaine 2% WITH epi Laceration details:    Location:  Shoulder/arm   Shoulder/arm location:  R lower arm   Length (cm):  8   Depth (mm):  15 Repair type:    Repair type:  Complex Pre-procedure details:    Preparation:  Patient was prepped and draped in usual sterile fashion Exploration:    Limited defect created (wound extended): no     Hemostasis achieved with:  Direct pressure and epinephrine   Wound exploration: wound explored through full range of motion     Wound extent: areolar tissue violated and fascia violated     Wound extent: no foreign bodies/material noted, no tendon damage noted and no underlying fracture noted   Treatment:    Area cleansed with:  Betadine and saline   Amount of cleaning:  Extensive   Irrigation solution:   Sterile saline Fascia repair:    Suture size:  4-0   Suture material:  Vicryl   Suture technique:  Simple interrupted   Number of sutures:  4 Subcutaneous repair:    Suture size:  4-0   Suture material:  Vicryl   Suture technique:  Simple interrupted   Number of sutures:  6 Skin repair:    Repair method:  Sutures   Suture size:  4-0   Suture material:  Prolene   Suture technique:  Simple interrupted   Number of sutures:  10 Approximation:    Approximation:  Close Post-procedure details:    Patient tolerance of procedure:  Tolerated well, no immediate complications   (including critical care time)  Medications Ordered in ED Medications  lidocaine-EPINEPHrine (XYLOCAINE W/EPI) 2 %-1:200000 (PF) injection 10 mL (has no administration in time range)  nicotine (NICODERM CQ - dosed in mg/24 hours) patch 21 mg (21 mg Transdermal Patch Applied 10/11/19 0410)  LORazepam (ATIVAN) injection 0.5 mg (0.5 mg Intravenous Not Given 10/11/19 0611)  LORazepam (ATIVAN) injection 0-4 mg ( Intravenous See Alternative 10/11/19 0610)    Or  LORazepam (ATIVAN) tablet 0-4 mg (1 mg Oral Given 10/11/19 0610)  LORazepam (ATIVAN) injection 0-4 mg (has no administration in time range)    Or  LORazepam (ATIVAN) tablet 0-4 mg (has no administration in time range)  thiamine tablet 100 mg (has no administration in time range)    Or  thiamine (B-1) injection 100 mg (has no administration in time range)  acetaminophen (TYLENOL) tablet 650 mg (has no administration in time range)  zolpidem (AMBIEN) tablet 5 mg (has no administration in time range)  alum & mag hydroxide-simeth (MAALOX/MYLANTA) 200-200-20 MG/5ML suspension 30 mL (has no administration in time range)  ondansetron (ZOFRAN) tablet 4 mg (has no administration in time range)  sodium chloride 0.9 % bolus 1,000  mL (0 mLs Intravenous Stopped 10/11/19 0451)    ED Course  I have reviewed the triage vital signs and the nursing notes.  Pertinent labs &  imaging results that were available during my care of the patient were reviewed by me and considered in my medical decision making (see chart for details).    MDM Rules/Calculators/A&P                      Patient presenting for evaluation after self-inflicted laceration involving the volar aspect of the right forearm.  He is left-hand dominant.  He is afebrile, vital signs are stable.  He is anxious but nontoxic in appearance.  He is neurovascularly intact.  He has baseline weakness to the right hand due to cerebral palsy per his report and states that it is unchanged.  He is able to flex and extend the wrist and digits against resistance.  He does tell me that he was intentionally self harming and has a history of intentional self-harm.  He tells me that he did not intend to cut himself so deep.  The laceration is quite deep and he has cut through the fascia and some of the muscle.  Doubt retained foreign body.  Base of the wound was visualized in a bloodless field and the wound was extensively irrigated.  He tolerated multi-layer repair without difficulty.  His tetanus is up-to-date.  We did discussed wound care and need for suture removal in 7 days.  He verbalized understanding of and agreement with this.  While in the ED the patient became agitated but was able to be redirected and de-escalate it.  He did attempt to leave multiple times.  Given his history of self-harm, the extent of this laceration and his erratic behavior he was placed under involuntary commitment.  Screening labs reviewed by myself show no leukocytosis, no anemia, no metabolic derangements, no renal insufficiency.  His ethanol level is elevated at 276.  He has been seen and evaluated by TTS who recommends overnight observation in a.m. psychiatric evaluation.  Final Clinical Impression(s) / ED Diagnoses Final diagnoses:  Self-inflicted laceration of right wrist [facility])  Involuntary commitment    Rx / DC Orders ED Discharge  Orders    None       Jeanie Sewer, PA-C 10/11/19 6659    Ward, Layla Maw, DO 10/14/19 2305

## 2019-10-11 NOTE — ED Notes (Signed)
Lunch Tray Ordered @ 1047. 

## 2019-10-11 NOTE — Discharge Instructions (Addendum)
1. Medications: Alternate 600 mg of ibuprofen and [phone] mg of Tylenol every 3 hours as needed for pain. Do not exceed 4000 mg of Tylenol daily.  Take ibuprofen with food to avoid upset stomach issues. 2. Treatment: ice for swelling, keep wound clean with warm soap and water and keep bandage dry, do not submerge in water for 24 hours 3. Follow Up: Please return in 7 days to have your stitches/staples removed or sooner if you have concerns.  You may also go to urgent care or your primary care physician.  I would recommend following up with the hand doctor for reevaluation as well to ensure no long-term complications due to this laceration.  If your appointment is within 1 week of the sutures being placed then he can remove the stitches at that visit.  Return to the emergency department sooner if any concerning signs or symptoms develop such as high fevers, redness, drainage of pus from the wound, or swelling.  4. Try to avoid drinking excessive amounts of alcohol. Consider going to AA for support to stop drinking.  5. If you are feeling suicidal get help. You can call the National Suicide Hotline-  National Suicide Prevention Lifeline 1800-SUICIDE or 872-865-7077. You can also return here if needed.   WOUND CARE  Keep area clean and dry for 24 hours. Do not remove bandage, if applied.  After 24 hours, remove bandage and wash wound gently with mild soap and warm water. Reapply a new bandage after cleaning wound, if directed.   Continue daily cleansing with soap and water until stitches/staples are removed.  Do not apply any ointments or creams to the wound while stitches/staples are in place, as this may cause delayed healing. Return if you experience any of the following signs of infection: Swelling, redness, pus drainage, streaking, fever >101.0 F  Return if you experience excessive bleeding that does not stop after 15-20 minutes of constant, firm pressure.

## 2021-09-20 ENCOUNTER — Other Ambulatory Visit (HOSPITAL_BASED_OUTPATIENT_CLINIC_OR_DEPARTMENT_OTHER): Payer: Self-pay

## 2021-09-20 ENCOUNTER — Other Ambulatory Visit: Payer: Self-pay

## 2021-09-20 ENCOUNTER — Emergency Department (HOSPITAL_BASED_OUTPATIENT_CLINIC_OR_DEPARTMENT_OTHER)
Admission: EM | Admit: 2021-09-20 | Discharge: 2021-09-20 | Disposition: A | Discharge status: Home / Self Care | Payer: Self-pay | Attending: Emergency Medicine | Admitting: Emergency Medicine

## 2021-09-20 ENCOUNTER — Encounter (HOSPITAL_BASED_OUTPATIENT_CLINIC_OR_DEPARTMENT_OTHER): Payer: Self-pay | Admitting: Emergency Medicine

## 2021-09-20 DIAGNOSIS — M436 Torticollis: Secondary | ICD-10-CM | POA: Insufficient documentation

## 2021-09-20 DIAGNOSIS — M62838 Other muscle spasm: Secondary | ICD-10-CM | POA: Insufficient documentation

## 2021-09-20 DIAGNOSIS — Z7982 Long term (current) use of aspirin: Secondary | ICD-10-CM | POA: Insufficient documentation

## 2021-09-20 MED ORDER — KETOROLAC TROMETHAMINE 60 MG/2ML IM SOLN
30.0000 mg | Freq: Once | INTRAMUSCULAR | Status: AC
  Administered 2021-09-20: 30 mg via INTRAMUSCULAR
  Filled 2021-09-20: qty 2

## 2021-09-20 MED ORDER — NAPROXEN 500 MG PO TABS
500.0000 mg | ORAL_TABLET | Freq: Two times a day (BID) | ORAL | 0 refills | Status: AC
  Filled 2021-09-20: qty 15, 8d supply, fill #0

## 2021-09-20 MED ORDER — CYCLOBENZAPRINE HCL 10 MG PO TABS
10.0000 mg | ORAL_TABLET | Freq: Two times a day (BID) | ORAL | 0 refills | Status: AC | PRN
  Filled 2021-09-20: qty 20, 10d supply, fill #0

## 2021-09-20 NOTE — ED Triage Notes (Signed)
Pt arrives to ED with c/o neck pain. Pt reports this morning he suddenly experienced right sided neck pain that radiates down his right arm. The pain is described as throbbing, shooting.  ?

## 2021-09-20 NOTE — ED Provider Notes (Signed)
?[facility] EMERGENCY DEPT ?Provider Note ? ? ?CSN: [id number] ?Arrival date & time: 09/20/21  0630 ? ?  ? ?History ? ?Chief Complaint  ?Patient presents with  ? Torticollis  ? ? ?George Mccormick is a 36 y.o. male. ? 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  He reports its more severe with lifting the arms, moving his head to the left and it will shoot down into his shoulder blade.  He denies any numbness or tingling in the arm.  Due to cerebral palsy when he was born he has always had some numbness in his fingers but reports that is no different.  He has no chest pain or shortness of breath.  He reports that his job is physical but yesterday was a day off and he cannot remember doing anything abnormal yesterday. ? ?The history is provided by the patient.  ? ?  ? ?Home Medications ?Prior to Admission medications   ?Medication Sig Start Date End Date Taking? Authorizing Provider  ?cyclobenzaprine (FLEXERIL) 10 MG tablet Take 1 tablet (10 mg total) by mouth 2 (two) times daily as needed for muscle spasms. 09/20/21  Yes Gwyneth Sprout, MD  ?naproxen (NAPROSYN) 500 MG tablet Take 1 tablet (500 mg total) by mouth 2 (two) times daily. 09/20/21  Yes Gwyneth Sprout, MD  ?Aspirin-Acetaminophen-Caffeine (GOODYS EXTRA STRENGTH) [phone] MG PACK Take 1 Package by mouth daily as needed (Pain).    [provider]  ?calcium carbonate (TUMS - DOSED IN MG ELEMENTAL CALCIUM) 500 MG chewable tablet Chew 1 tablet by mouth 2 (two) times daily as needed for indigestion or  heartburn.    [provider]  ?HYDROcodone-acetaminophen (NORCO/VICODIN) 5-325 MG tablet Take 1-2 tablets by mouth every 6 hours as needed for pain and/or cough. ?Patient not taking: Reported on 10/11/2019 07/11/15   Pisciotta, Joni Reining, PA-C  ?   ? ?Allergies    ?Patient has no known allergies.   ? ?Review of Systems   ?Review of Systems ? ?Physical Exam ?Updated Vital Signs ?BP 134/89 (BP Location: Right Arm)   Pulse (!) 101   Temp 98.8 ?F (37.1 ?C)   Resp 16   Ht 5\' 7"  (1.702 m)   Wt 59 kg   SpO2 99%   BMI 20.36 kg/m?  ?Physical Exam ?Vitals and nursing note reviewed.  ?Constitutional:   ?   General: He is not in acute distress. ?   Appearance: Normal appearance.  ?HENT:  ?   Head: Normocephalic.  ?   Nose: Nose normal.  ?Eyes:  ?   Pupils: Pupils are equal, round, and reactive to light.  ?Neck:  ? ?   Comments: No superior clavicular nodes present.  Pain reproduced when patient looks to the left ?Cardiovascular:  ?   Rate and Rhythm: Normal rate.  ?   Pulses: Normal pulses.  ?Musculoskeletal:  ?   Cervical back: Neck supple. Torticollis and tenderness present.  ?Skin: ?   General: Skin is warm and dry.  ?Neurological:  ?  Mental Status: He is alert. Mental status is at baseline.  ?   Sensory: No sensory deficit.  ?   Motor: No weakness.  ?Psychiatric:     ?   Mood and Affect: Mood normal.  ? ? ?ED Results / Procedures / Treatments   ?Labs ?(all labs ordered are listed, but only abnormal results are displayed) ?Labs Reviewed - No data to display ? ?EKG ?EKG Interpretation ? ?Date/Time:  Monday September 20 2021 09:06:17 EDT ?Ventricular Rate:  92 ?PR Interval:  154 ?QRS Duration: 84 ?QT Interval:  336 ?QTC Calculation: 415 ?R Axis:   82 ?Text Interpretation: Sinus rhythm with marked sinus arrhythmia No previous ECGs available Confirmed by Gwyneth Sprout ([id number]) on 09/20/2021 9:57:15 AM ? ?Radiology ?No results found. ? ?Procedures ?Procedures  ? ? ?Medications Ordered in ED ?Medications  ?ketorolac  (TORADOL) injection 30 mg (has no administration in time range)  ? ? ?ED Course/ Medical Decision Making/ A&P ?  ?                        ?Medical Decision Making ?Risk ?Prescription drug management. ? ? ?Patient's presenting today with symptoms most classic for muscle spasm and mild torticollis.  Patient has palpable spasm and pain in the trapezius muscle of the right.  There is low suspicion for vertebral artery dissection as patient has no risk factors no recent trauma and was not moving his neck when the pain started.  He has normal pulses in the extremities and low suspicion for vascular abnormality or DVT.  We will treat with anti-inflammatories and muscle relaxer.  Patient given return precautions.  Patient had an EKG done upon arrival however was not complaining of any chest pain or shortness of breath.  He has no old to compare but no findings concerning for ACS based on my independent interpretation. ? ? ? ? ? ? ? ?Final Clinical Impression(s) / ED Diagnoses ?Final diagnoses:  ?Trapezius muscle spasm  ? ? ?Rx / DC Orders ?ED Discharge Orders   ? ?      Ordered  ?  naproxen (NAPROSYN) 500 MG tablet  2 times daily       ? 09/20/21 1001  ?  cyclobenzaprine (FLEXERIL) 10 MG tablet  2 times daily PRN       ? 09/20/21 1001  ? ?  ?  ? ?  ? ? ?  ?Gwyneth Sprout, MD ?09/20/21 1002 ? ?

## 2021-09-20 NOTE — Discharge Instructions (Signed)
Try ice or heating pad.  Massaging the area may also help.  You can also try Biofreeze or IcyHot.  You do not need to take any naproxen until tomorrow but you can use Tylenol as needed for today. ?

## 2021-09-23 ENCOUNTER — Ambulatory Visit: Payer: Self-pay | Admitting: Family Medicine

## 2021-11-25 ENCOUNTER — Other Ambulatory Visit: Payer: Self-pay

## 2021-11-25 ENCOUNTER — Encounter (HOSPITAL_BASED_OUTPATIENT_CLINIC_OR_DEPARTMENT_OTHER): Payer: Self-pay | Admitting: Emergency Medicine

## 2021-11-25 ENCOUNTER — Emergency Department (HOSPITAL_BASED_OUTPATIENT_CLINIC_OR_DEPARTMENT_OTHER)
Admission: EM | Admit: 2021-11-25 | Discharge: 2021-11-25 | Disposition: A | Discharge status: Home / Self Care | Payer: Self-pay | Attending: Emergency Medicine | Admitting: Emergency Medicine

## 2021-11-25 DIAGNOSIS — Z7982 Long term (current) use of aspirin: Secondary | ICD-10-CM | POA: Insufficient documentation

## 2021-11-25 DIAGNOSIS — H6122 Impacted cerumen, left ear: Secondary | ICD-10-CM | POA: Insufficient documentation

## 2021-11-25 NOTE — Discharge Instructions (Signed)
If you do not have a family doctor I have provided information for the clinic upstairs if you would like to try making appointment.

## 2021-11-25 NOTE — ED Triage Notes (Signed)
Pt presents with left ear "clogged", I cant hear and my balance is off. Pt reports this happens every year or two ,nothing at home helps and comes in to have ear cleaned out.

## 2021-11-25 NOTE — ED Notes (Signed)
Left ear flushed with warm water. Small clot of what appears to be dirt removed and patient states he can hear and already feels better.

## 2021-11-25 NOTE — ED Notes (Signed)
Dc instructions reviewed with patient. Patient voiced understanding. Dc with belongings.  °

## 2021-11-25 NOTE — ED Provider Notes (Signed)
[facility] EMERGENCY DEPT Provider Note   CSN: [id number] Arrival date & time: 11/25/21  9166     History  Chief Complaint  Patient presents with   Otalgia    George Mccormick is a 36 y.o. male.  36 yo M with a chief complaints of a wax impaction.  Says this happens at least a couple times a year.  He tried to clean it out with some peroxide and alcohol at home but without improvement.  Feels like he cannot hear anything out of that ear and feels a little bit off balance.  He has a trip planned for tomorrow and wanted to get this cleaned up before he got on an airplane.  Denies cough or congestion.  Denies trauma to the ear.  Denies foreign body.   Otalgia     Home Medications Prior to Admission medications   Medication Sig Start Date End Date Taking? Authorizing Provider  Aspirin-Acetaminophen-Caffeine (GOODYS EXTRA STRENGTH) [phone] MG PACK Take 1 Package by mouth daily as needed (Pain).    [provider]  calcium carbonate (TUMS - DOSED IN MG ELEMENTAL CALCIUM) 500 MG chewable tablet Chew 1 tablet by mouth 2 (two) times daily as needed for indigestion or heartburn.    [provider]  cyclobenzaprine (FLEXERIL) 10 MG tablet Take 1 tablet (10 mg total) by mouth 2 (two) times daily as needed for muscle spasms. 09/20/21   Gwyneth Sprout, MD  HYDROcodone-acetaminophen (NORCO/VICODIN) 5-325 MG tablet Take 1-2 tablets by mouth every 6 hours as needed for pain and/or cough. Patient not taking: Reported on 10/11/2019 07/11/15   Pisciotta, Joni Reining, PA-C  naproxen (NAPROSYN) 500 MG tablet Take 1 tablet (500 mg total) by mouth 2 (two) times daily. 09/20/21   Gwyneth Sprout, MD      Allergies    Patient has no known allergies.    Review of Systems   Review of Systems  HENT:  Positive for ear pain.    Physical Exam Updated Vital Signs BP (!) 147/112   Pulse 85   Temp 98.7 F (37.1 C) (Oral)   Resp 16   SpO2 96%  Physical Exam Vitals and  nursing note reviewed.  Constitutional:      Appearance: He is well-developed.  HENT:     Head: Normocephalic and atraumatic.     Ears:     Comments: Right TM is normal.  Left TM wax is impacted and obscures the view. Eyes:     Pupils: Pupils are equal, round, and reactive to light.  Neck:     Vascular: No JVD.  Cardiovascular:     Rate and Rhythm: Normal rate and regular rhythm.     Heart sounds: No murmur heard.   No friction rub. No gallop.  Pulmonary:     Effort: No respiratory distress.     Breath sounds: No wheezing.  Abdominal:     General: There is no distension.     Tenderness: There is no abdominal tenderness. There is no guarding or rebound.  Musculoskeletal:        General: Normal range of motion.     Cervical back: Normal range of motion and neck supple.  Skin:    Coloration: Skin is not pale.     Findings: No rash.  Neurological:     Mental Status: He is alert and oriented to person, place, and time.  Psychiatric:        Behavior: Behavior normal.    ED Results / Procedures /  Treatments   Labs (all labs ordered are listed, but only abnormal results are displayed) Labs Reviewed - No data to display  EKG None  Radiology No results found.  Procedures Procedures    Medications Ordered in ED Medications - No data to display  ED Course/ Medical Decision Making/ A&P                           Medical Decision Making  36 yo M with a chief complaints of wax impaction.  Is here to have it irrigated.  Irrigated by nursing patient feeling better discharged home.  8:22 AM:  I have discussed the diagnosis/risks/treatment options with the patient.  Evaluation and diagnostic testing in the emergency department does not suggest an emergent condition requiring admission or immediate intervention beyond what has been performed at this time.  They will follow up with  PCP. We also discussed returning to the ED immediately if new or worsening sx occur. We discussed  the sx which are most concerning (e.g., sudden worsening pain, fever, inability to tolerate by mouth) that necessitate immediate return. Medications administered to the patient during their visit and any new prescriptions provided to the patient are listed below.  Medications given during this visit Medications - No data to display   The patient appears reasonably screen and/or stabilized for discharge and I doubt any other medical condition or other [facility] requiring further screening, evaluation, or treatment in the ED at this time prior to discharge.          Final Clinical Impression(s) / ED Diagnoses Final diagnoses:  Impacted cerumen of left ear    Rx / DC Orders ED Discharge Orders     None         Deno Etienne, DO 11/25/21 EC:[id number]

## 2022-01-19 ENCOUNTER — Emergency Department (HOSPITAL_BASED_OUTPATIENT_CLINIC_OR_DEPARTMENT_OTHER)
Admission: EM | Admit: 2022-01-19 | Discharge: 2022-01-19 | Disposition: A | Discharge status: Home / Self Care | Payer: Self-pay | Attending: Emergency Medicine | Admitting: Emergency Medicine

## 2022-01-19 ENCOUNTER — Encounter (HOSPITAL_BASED_OUTPATIENT_CLINIC_OR_DEPARTMENT_OTHER): Payer: Self-pay | Admitting: Emergency Medicine

## 2022-01-19 ENCOUNTER — Other Ambulatory Visit: Payer: Self-pay

## 2022-01-19 ENCOUNTER — Emergency Department (HOSPITAL_BASED_OUTPATIENT_CLINIC_OR_DEPARTMENT_OTHER): Payer: Self-pay

## 2022-01-19 DIAGNOSIS — K4090 Unilateral inguinal hernia, without obstruction or gangrene, not specified as recurrent: Secondary | ICD-10-CM | POA: Insufficient documentation

## 2022-01-19 DIAGNOSIS — Y990 Civilian activity done for income or pay: Secondary | ICD-10-CM | POA: Insufficient documentation

## 2022-01-19 DIAGNOSIS — X500XXA Overexertion from strenuous movement or load, initial encounter: Secondary | ICD-10-CM | POA: Insufficient documentation

## 2022-01-19 LAB — CBC WITH DIFFERENTIAL/PLATELET
Abs Immature Granulocytes: 0.01 10*3/uL (ref 0.00–0.07)
Basophils Absolute: 0.1 10*3/uL (ref 0.0–0.1)
Basophils Relative: 1 %
Eosinophils Absolute: 0.4 10*3/uL (ref 0.0–0.5)
Eosinophils Relative: 5 %
HCT: 44.2 % (ref 39.0–52.0)
Hemoglobin: 15.6 g/dL (ref 13.0–17.0)
Immature Granulocytes: 0 %
Lymphocytes Relative: 28 %
Lymphs Abs: 2.1 10*3/uL (ref 0.7–4.0)
MCH: 33.6 pg (ref 26.0–34.0)
MCHC: 35.3 g/dL (ref 30.0–36.0)
MCV: 95.3 fL (ref 80.0–100.0)
Monocytes Absolute: 0.5 10*3/uL (ref 0.1–1.0)
Monocytes Relative: 7 %
Neutro Abs: 4.5 10*3/uL (ref 1.7–7.7)
Neutrophils Relative %: 59 %
Platelets: 291 10*3/uL (ref 150–400)
RBC: 4.64 MIL/uL (ref 4.22–5.81)
RDW: 12.3 % (ref 11.5–15.5)
WBC: 7.5 10*3/uL (ref 4.0–10.5)
nRBC: 0 % (ref 0.0–0.2)

## 2022-01-19 LAB — COMPREHENSIVE METABOLIC PANEL
ALT: 13 U/L (ref 0–44)
AST: 16 U/L (ref 15–41)
Albumin: 4.8 g/dL (ref 3.5–5.0)
Alkaline Phosphatase: 53 U/L (ref 38–126)
Anion gap: 9 (ref 5–15)
BUN: 11 mg/dL (ref 6–20)
CO2: 25 mmol/L (ref 22–32)
Calcium: 9.3 mg/dL (ref 8.9–10.3)
Chloride: 106 mmol/L (ref 98–111)
Creatinine, Ser: 0.79 mg/dL (ref 0.61–1.24)
GFR, Estimated: >60 mL/min (ref >60)
Glucose, Bld: 82 mg/dL (ref 70–99)
Potassium: 4 mmol/L (ref 3.5–5.1)
Sodium: 140 mmol/L (ref 135–145)
Total Bilirubin: 0.6 mg/dL (ref 0.3–1.2)
Total Protein: 7 g/dL (ref 6.5–8.1)

## 2022-01-19 LAB — LACTIC ACID, PLASMA: Lactic Acid, Venous: 0.6 mmol/L (ref 0.5–1.9)

## 2022-01-19 MED ORDER — IOHEXOL 300 MG/ML  SOLN
100.0000 mL | Freq: Once | INTRAMUSCULAR | Status: AC | PRN
  Administered 2022-01-19: 80 mL via INTRAVENOUS

## 2022-01-19 MED ORDER — HYDROCODONE-ACETAMINOPHEN 5-325 MG PO TABS
1.0000 | ORAL_TABLET | Freq: Once | ORAL | Status: AC
  Administered 2022-01-19: 1 via ORAL
  Filled 2022-01-19: qty 1

## 2022-01-19 NOTE — ED Triage Notes (Signed)
Hx of hernia. States he lifted something today and developed pain.

## 2022-01-19 NOTE — ED Provider Notes (Signed)
[facility] EMERGENCY DEPT Provider Note   CSN: [id number] Arrival date & time: 01/19/22  1505     History  Chief Complaint  Patient presents with   Hernia    George Mccormick is a 36 y.o. male.  36 year old male with no past medical history presents to the ED with a chief complaint of right lower abdominal pain status post lifting.  Patient reports he is got a right inguinal hernia, states he came up with his diagnoses after googling it.  Reports he was lifting heavy object on today's worksite when suddenly he felt a sharp pain to the right lower quadrant.  This pain was accompanied with tingling and radiation down to his right testicle.  Pain is exacerbated with any type of lifting, walking, movement.  No alleviating factors.  He has not taken any medication for improvement in symptoms.  He does not have any prior history of hernia repair.  No nausea, no vomiting, no fevers.  Has been able to void since incident happened.  Last bowel movement was this morning.  The history is provided by the patient and medical records.       Home Medications Prior to Admission medications   Medication Sig Start Date End Date Taking? Authorizing Provider  Aspirin-Acetaminophen-Caffeine (GOODYS EXTRA STRENGTH) [phone] MG PACK Take 1 Package by mouth daily as needed (Pain).    [provider]  calcium carbonate (TUMS - DOSED IN MG ELEMENTAL CALCIUM) 500 MG chewable tablet Chew 1 tablet by mouth 2 (two) times daily as needed for indigestion or heartburn.    [provider]  cyclobenzaprine (FLEXERIL) 10 MG tablet Take 1 tablet (10 mg total) by mouth 2 (two) times daily as needed for muscle spasms. 09/20/21   Gwyneth Sprout, MD  HYDROcodone-acetaminophen (NORCO/VICODIN) 5-325 MG tablet Take 1-2 tablets by mouth every 6 hours as needed for pain and/or cough. Patient not taking: Reported on 10/11/2019 07/11/15   Pisciotta, Joni Reining, PA-C  naproxen (NAPROSYN) 500 MG tablet  Take 1 tablet (500 mg total) by mouth 2 (two) times daily. 09/20/21   Gwyneth Sprout, MD      Allergies    Patient has no known allergies.    Review of Systems   Review of Systems  Constitutional:  Negative for chills and fever.  Respiratory:  Negative for shortness of breath.   Cardiovascular:  Negative for chest pain.  Gastrointestinal:  Positive for abdominal pain. Negative for nausea and vomiting.  Genitourinary:  Negative for flank pain.  Musculoskeletal:  Negative for back pain.  Neurological:  Negative for headaches.  All other systems reviewed and are negative.   Physical Exam Updated Vital Signs BP 131/86   Pulse 65   Temp 98.9 F (37.2 C)   Resp 14   Ht 5\' 7"  (1.702 m)   Wt 61.2 kg   SpO2 98%   BMI 21.14 kg/m  Physical Exam Vitals and nursing note reviewed. Exam conducted with a chaperone present.  Constitutional:      Appearance: Normal appearance.  HENT:     Head: Normocephalic and atraumatic.     Mouth/Throat:     Mouth: Mucous membranes are moist.  Eyes:     Pupils: Pupils are equal, round, and reactive to light.  Cardiovascular:     Rate and Rhythm: Normal rate.  Pulmonary:     Effort: Pulmonary effort is normal.     Breath sounds: No wheezing or rales.  Abdominal:     General: Abdomen is flat.  Tenderness: There is no abdominal tenderness. There is no right CVA tenderness or left CVA tenderness.     Hernia: A hernia is present. Hernia is present in the right inguinal area.  Genitourinary:    Comments: Palpable right inguinal hernia present. Tenderness to palpation but able to reduce. Chaperoned by Fortino Sic. Musculoskeletal:     Cervical back: Normal range of motion and neck supple. No tenderness.  Lymphadenopathy:     Lower Body: No right inguinal adenopathy.  Skin:    General: Skin is warm and dry.  Neurological:     Mental Status: He is alert and oriented to person, place, and time.     ED Results / Procedures / Treatments    Labs (all labs ordered are listed, but only abnormal results are displayed) Labs Reviewed  CBC WITH DIFFERENTIAL/PLATELET  COMPREHENSIVE METABOLIC PANEL  LACTIC ACID, PLASMA  LACTIC ACID, PLASMA    EKG None  Radiology CT ABDOMEN PELVIS W CONTRAST  Result Date: 01/19/2022 CLINICAL DATA:  Hernia, complicated. h/o hernia, abdominal pain after lifting something heavy today EXAM: CT ABDOMEN AND PELVIS WITH CONTRAST TECHNIQUE: Multidetector CT imaging of the abdomen and pelvis was performed using the standard protocol following bolus administration of intravenous contrast. RADIATION DOSE REDUCTION: This exam was performed according to the departmental dose-optimization program which includes automated exposure control, adjustment of the mA and/or kV according to patient size and/or use of iterative reconstruction technique. CONTRAST:  1mL OMNIPAQUE IOHEXOL 300 MG/ML  SOLN COMPARISON:  None Available. FINDINGS: Lower chest: No acute abnormality. Hepatobiliary: No focal liver abnormality. No gallstones, gallbladder wall thickening, or pericholecystic fluid. No biliary dilatation. Pancreas: No focal lesion. Normal pancreatic contour. No surrounding inflammatory changes. No main pancreatic ductal dilatation. Spleen: Normal in size without focal abnormality. Adrenals/Urinary Tract: No adrenal nodule bilaterally. Bilateral kidneys enhance symmetrically. No hydronephrosis. No hydroureter. The urinary bladder is unremarkable. Stomach/Bowel: Stomach is within normal limits. No evidence of bowel wall thickening or dilatation. Appendix appears normal. Vascular/Lymphatic: No abdominal aorta or iliac aneurysm.No abdominal, pelvic, or inguinal lymphadenopathy. Reproductive: Prostate is unremarkable. Other: No intraperitoneal free fluid. No intraperitoneal free gas. No organized fluid collection. Musculoskeletal: Tiny couple of abdominal defects along the supraumbilical ventral regions possible (6:60). Trace fat  containing right inguinal hernia. No suspicious lytic or blastic osseous lesions. No acute displaced fracture. IMPRESSION: 1. No acute intra-abdominal or intrapelvic abnormality. 2. Trace fat containing right inguinal hernia. 3. Tiny couple of abdominal defects along the supraumbilical ventral regions possible. These are hard to delineate and would only contain fat. Electronically Signed   By: Tish Frederickson M.D.   On: 01/19/2022 18:54    Procedures Procedures    Medications Ordered in ED Medications  HYDROcodone-acetaminophen (NORCO/VICODIN) 5-325 MG per tablet 1 tablet (1 tablet Oral Given 01/19/22 1828)  iohexol (OMNIPAQUE) 300 MG/ML solution 100 mL (80 mLs Intravenous Contrast Given 01/19/22 1836)    ED Course/ Medical Decision Making/ A&P                           Medical Decision Making Amount and/or Complexity of Data Reviewed Labs: ordered. Radiology: ordered.  Risk Prescription drug management.    This patient presents to the ED for concern of right abdominal pain, this involves a number of treatment options, and is a complaint that carries with it a high risk of complications and morbidity.  The differential diagnosis includes incarcerated, strangulated versus laceration of pain to his hernia.  Co morbidities: Discussed in HPI   Brief History:  Patient with no pertinent past medical history here with right lower quadrant pain along the pelvic area after heavy lifting at work.  Prior history of inguinal hernia although this was diagnosed by him.  No nausea, no vomiting, no fevers.  EMR reviewed including pt PMHx, past surgical history and past visits to ER.   See HPI for more details   Lab Tests:  I ordered and independently interpreted labs.  The pertinent results include:    I personally reviewed all laboratory work and imaging. Metabolic panel without any acute abnormality specifically kidney function within normal limits and no significant electrolyte  abnormalities. CBC without leukocytosis or significant anemia.   Imaging Studies:  NAD. I personally reviewed all imaging studies and no acute abnormality found. I agree with radiology interpretation.    Cardiac Monitoring:  NA NA   Medicines ordered:  I ordered medication including Norco  for pain control Reevaluation of the patient after these medicines showed that the patient improved I have reviewed the patients home medicines and have made adjustments as needed   Reevaluation:  After the interventions noted above I re-evaluated patient and found that they have :improved   Social Determinants of Health:  The patient's social determinants of health were a factor in the care of this patient    Problem List / ED Course:  Patient here with right lower quadrant pain after lifting a toilet.  Labs are benign.  Lactic acid is negative.  Labs are benign without any acute finding.  CT abdomen was obtained to rule out strangulation, incarceration versus ischemic bowel from his right inguinal hernia.  This did not show any acute findings.  He was given Norco for pain control.  Here without any nausea or vomiting.  No prior surgical intervention to the abdomen.  I did discuss outpatient general surgery follow-up.  Patient currently works as a Development worker, community, does do amount of heavy lifting, we did discuss bracing, anti-inflammatories along with rest.  He was given strict return precautions.  He is agreeable to plan and treatment.  Patient stable for discharge.   Dispostion:  After consideration of the diagnostic results and the patients response to treatment, I feel that the patent would benefit from outpatient follow up with general surgery.     Portions of this note were generated with Lobby[employer]. Dictation errors may occur despite best attempts at proofreading.   Final Clinical Impression(s) / ED Diagnoses Final diagnoses:  Right inguinal hernia    Rx / DC  Orders ED Discharge Orders     None         Janeece Fitting, Hershal Coria 01/19/22 Achille Rich, MD 01/20/22 1710

## 2022-01-19 NOTE — ED Notes (Signed)
Patient transported to CT 

## 2022-01-19 NOTE — ED Notes (Signed)
Reviewed AVS/discharge instruction with patient. Time allotted for and all questions answered. Patient is agreeable for d/c and escorted to ed exit by staff.  

## 2022-01-19 NOTE — Discharge Instructions (Addendum)
Your laboratory results are within normal limits today.  You are provided with a copy of your CT abdomen and pelvis on today's visit, please schedule an appointment with trocar and no surgery for further management if your symptoms worsen.

## 2022-08-01 ENCOUNTER — Other Ambulatory Visit: Payer: Self-pay

## 2022-10-13 ENCOUNTER — Encounter: Payer: Self-pay | Admitting: *Deleted

## 2023-03-02 ENCOUNTER — Other Ambulatory Visit (HOSPITAL_COMMUNITY): Payer: Self-pay

## 2024-01-30 ENCOUNTER — Other Ambulatory Visit (HOSPITAL_COMMUNITY): Payer: Self-pay
